# Patient Record
Sex: Male | Born: 1980 | Race: Black or African American | Hispanic: No | Marital: Single | Smoking: Never smoker
Health system: Southern US, Community
[De-identification: ages and names within clinical notes are randomized; demographics above are authoritative.]

## PROBLEM LIST (undated history)

## (undated) HISTORY — PX: OTHER SURGICAL HISTORY: SHX169

---

## 2016-12-17 ENCOUNTER — Ambulatory Visit (INDEPENDENT_AMBULATORY_CARE_PROVIDER_SITE_OTHER): Payer: Self-pay

## 2016-12-17 ENCOUNTER — Ambulatory Visit (INDEPENDENT_AMBULATORY_CARE_PROVIDER_SITE_OTHER): Payer: Self-pay | Admitting: Orthopaedic Surgery

## 2016-12-17 ENCOUNTER — Encounter (INDEPENDENT_AMBULATORY_CARE_PROVIDER_SITE_OTHER): Payer: Self-pay | Admitting: Orthopaedic Surgery

## 2016-12-17 VITALS — BP 136/97 | HR 59 | Resp 14 | Ht 65.0 in | Wt 170.0 lb

## 2016-12-17 DIAGNOSIS — M545 Low back pain: Secondary | ICD-10-CM

## 2016-12-17 DIAGNOSIS — G8929 Other chronic pain: Secondary | ICD-10-CM

## 2016-12-17 DIAGNOSIS — M542 Cervicalgia: Secondary | ICD-10-CM

## 2016-12-17 MED ORDER — METHYLPREDNISOLONE 4 MG PO TABS: ORAL_TABLET | ORAL | 0 refills | Status: AC
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
## 2016-12-17 NOTE — Addendum Note (Signed)
Addended by: Mare LoanLAWSON, Jordy Verba M on: 12/17/2016 02:47 PM   Modules accepted: Orders

## 2016-12-29 ENCOUNTER — Inpatient Hospital Stay: Admission: RE | Admit: 2016-12-29 | Payer: Self-pay | Source: Ambulatory Visit

## 2017-01-06 ENCOUNTER — Ambulatory Visit
Admission: RE | Admit: 2017-01-06 | Discharge: 2017-01-06 | Disposition: A | Discharge status: Home / Self Care | Payer: No Typology Code available for payment source | Source: Ambulatory Visit | Attending: Orthopaedic Surgery | Admitting: Orthopaedic Surgery

## 2017-01-06 ENCOUNTER — Other Ambulatory Visit: Payer: Self-pay

## 2017-01-06 DIAGNOSIS — G8929 Other chronic pain: Secondary | ICD-10-CM

## 2017-01-06 DIAGNOSIS — M545 Low back pain: Principal | ICD-10-CM

## 2017-01-07 ENCOUNTER — Ambulatory Visit (INDEPENDENT_AMBULATORY_CARE_PROVIDER_SITE_OTHER): Payer: Self-pay | Admitting: Orthopaedic Surgery

## 2017-01-11 ENCOUNTER — Telehealth (INDEPENDENT_AMBULATORY_CARE_PROVIDER_SITE_OTHER): Payer: Self-pay | Admitting: Orthopaedic Surgery

## 2017-01-11 NOTE — Telephone Encounter (Signed)
Patient is wanting to know if he can get his results for his MRI over the phone, he is currently working in [facility].  CB#[phone].  Thank you.

## 2017-01-11 NOTE — Telephone Encounter (Signed)
called

## 2017-01-11 NOTE — Telephone Encounter (Signed)
See message.

## 2017-01-22 ENCOUNTER — Telehealth: Payer: Self-pay | Admitting: *Deleted

## 2017-01-22 NOTE — Telephone Encounter (Signed)
Adair LaundryLatonya patient's friend called in this afternoon in regards to wanting to know if he could have anything else for pain besides the Cortisone shots? He is having severe pain in his neck and back.  His CB # [phone]. Thank you

## 2017-01-22 NOTE — Telephone Encounter (Signed)
Please advise 

## 2017-01-23 NOTE — Telephone Encounter (Signed)
Needs to see Dr Pamelia HoitNewton-pleasse refer. Consider meds after he sees Dr NDorris Carnes

## 2017-01-24 ENCOUNTER — Other Ambulatory Visit (INDEPENDENT_AMBULATORY_CARE_PROVIDER_SITE_OTHER): Payer: Self-pay

## 2017-01-24 DIAGNOSIS — G8929 Other chronic pain: Secondary | ICD-10-CM

## 2017-01-24 DIAGNOSIS — M545 Low back pain: Principal | ICD-10-CM

## 2017-01-24 NOTE — Telephone Encounter (Signed)
referral sent
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
